# Patient Record
Sex: Male | Born: 1970 | Race: Asian | Hispanic: No | Marital: Married | Smoking: Never smoker
Health system: Southern US, Community
[De-identification: ages and names within clinical notes are randomized; demographics above are authoritative.]

## PROBLEM LIST (undated history)

## (undated) DIAGNOSIS — G44209 Tension-type headache, unspecified, not intractable: Secondary | ICD-10-CM

## (undated) HISTORY — DX: Tension-type headache, unspecified, not intractable: G44.209

## (undated) HISTORY — PX: NO PAST SURGERIES: SHX2092

---

## 2012-12-02 ENCOUNTER — Ambulatory Visit
Admission: RE | Admit: 2012-12-02 | Discharge: 2012-12-02 | Disposition: A | Discharge status: Home / Self Care | Payer: No Typology Code available for payment source | Source: Ambulatory Visit | Attending: Family Medicine | Admitting: Family Medicine

## 2012-12-02 ENCOUNTER — Other Ambulatory Visit: Payer: Self-pay | Admitting: Family Medicine

## 2012-12-02 DIAGNOSIS — R9389 Abnormal findings on diagnostic imaging of other specified body structures: Secondary | ICD-10-CM

## 2012-12-02 DIAGNOSIS — R509 Fever, unspecified: Secondary | ICD-10-CM

## 2012-12-02 DIAGNOSIS — R1110 Vomiting, unspecified: Secondary | ICD-10-CM

## 2012-12-02 DIAGNOSIS — R059 Cough, unspecified: Secondary | ICD-10-CM

## 2012-12-02 DIAGNOSIS — R05 Cough: Secondary | ICD-10-CM

## 2012-12-02 MED ORDER — IOHEXOL 300 MG/ML  SOLN
75.0000 mL | Freq: Once | INTRAMUSCULAR | Status: AC | PRN
  Administered 2012-12-02: 75 mL via INTRAVENOUS

## 2013-06-01 ENCOUNTER — Ambulatory Visit
Admission: RE | Admit: 2013-06-01 | Discharge: 2013-06-01 | Disposition: A | Discharge status: Home / Self Care | Payer: PRIVATE HEALTH INSURANCE | Source: Ambulatory Visit | Attending: Infectious Disease | Admitting: Infectious Disease

## 2013-06-01 ENCOUNTER — Other Ambulatory Visit: Payer: Self-pay | Admitting: Infectious Disease

## 2013-06-01 DIAGNOSIS — A159 Respiratory tuberculosis unspecified: Secondary | ICD-10-CM

## 2013-10-04 ENCOUNTER — Other Ambulatory Visit: Payer: Self-pay | Admitting: Family Medicine

## 2013-10-04 ENCOUNTER — Ambulatory Visit
Admission: RE | Admit: 2013-10-04 | Discharge: 2013-10-04 | Disposition: A | Discharge status: Home / Self Care | Payer: PRIVATE HEALTH INSURANCE | Source: Ambulatory Visit | Attending: Family Medicine | Admitting: Family Medicine

## 2013-10-04 DIAGNOSIS — R7611 Nonspecific reaction to tuberculin skin test without active tuberculosis: Secondary | ICD-10-CM

## 2014-09-11 ENCOUNTER — Ambulatory Visit: Payer: PRIVATE HEALTH INSURANCE | Admitting: Physician Assistant

## 2014-09-11 ENCOUNTER — Telehealth: Payer: Self-pay

## 2014-09-11 NOTE — Telephone Encounter (Signed)
Spoke with patient brother who states that he will advise patient. He states that his brother does not speak english very well.

## 2014-09-12 ENCOUNTER — Encounter: Payer: Self-pay | Admitting: Physician Assistant

## 2014-09-12 ENCOUNTER — Ambulatory Visit (INDEPENDENT_AMBULATORY_CARE_PROVIDER_SITE_OTHER): Payer: PRIVATE HEALTH INSURANCE | Admitting: Physician Assistant

## 2014-09-12 VITALS — BP 121/75 | HR 59 | Temp 98.5°F | Resp 16 | Ht 66.0 in | Wt 154.0 lb

## 2014-09-12 DIAGNOSIS — R51 Headache: Secondary | ICD-10-CM

## 2014-09-12 DIAGNOSIS — R519 Headache, unspecified: Secondary | ICD-10-CM

## 2014-09-12 DIAGNOSIS — G44209 Tension-type headache, unspecified, not intractable: Secondary | ICD-10-CM | POA: Insufficient documentation

## 2014-09-12 LAB — LIPID PANEL
Cholesterol: 171 mg/dL (ref 0–200)
HDL: 35.7 mg/dL — ABNORMAL LOW (ref >39.00)
NonHDL: 135.3
Total CHOL/HDL Ratio: 5
Triglycerides: 311 mg/dL — ABNORMAL HIGH (ref 0.0–149.0)
VLDL: 62.2 mg/dL — ABNORMAL HIGH (ref 0.0–40.0)

## 2014-09-12 LAB — CBC
HCT: 46.3 % (ref 39.0–52.0)
Hemoglobin: 15.6 g/dL (ref 13.0–17.0)
MCHC: 33.8 g/dL (ref 30.0–36.0)
MCV: 86.3 fl (ref 78.0–100.0)
Platelets: 259 10*3/uL (ref 150.0–400.0)
RBC: 5.37 Mil/uL (ref 4.22–5.81)
RDW: 13.6 % (ref 11.5–15.5)
WBC: 5.6 10*3/uL (ref 4.0–10.5)

## 2014-09-12 LAB — COMPREHENSIVE METABOLIC PANEL
ALT: 35 U/L (ref 0–53)
AST: 24 U/L (ref 0–37)
Albumin: 4.2 g/dL (ref 3.5–5.2)
Alkaline Phosphatase: 74 U/L (ref 39–117)
BUN: 12 mg/dL (ref 6–23)
CO2: 29 mEq/L (ref 19–32)
Calcium: 9.2 mg/dL (ref 8.4–10.5)
Chloride: 105 mEq/L (ref 96–112)
Creatinine, Ser: 0.88 mg/dL (ref 0.40–1.50)
GFR: 100.85 mL/min (ref >60.00)
Glucose, Bld: 93 mg/dL (ref 70–99)
Potassium: 3.9 mEq/L (ref 3.5–5.1)
Sodium: 139 mEq/L (ref 135–145)
Total Bilirubin: 0.4 mg/dL (ref 0.2–1.2)
Total Protein: 7.1 g/dL (ref 6.0–8.3)

## 2014-09-12 LAB — SEDIMENTATION RATE: Sed Rate: 8 mm/hr (ref 0–22)

## 2014-09-12 LAB — LDL CHOLESTEROL, DIRECT: Direct LDL: 100 mg/dL

## 2014-09-12 NOTE — Progress Notes (Signed)
Pre visit review using our clinic review tool, if applicable. No additional management support is needed unless otherwise documented below in the visit note. 

## 2014-09-12 NOTE — Assessment & Plan Note (Signed)
One current seems like a tension headache. No recurrence. Patient asymptomatic at present. Discussed supportive measures. Do not think a big workup is warranted at present. Patient seems concerned about this episode. Will check CBC, CMP lipase and sedimentation rate.

## 2014-09-12 NOTE — Progress Notes (Signed)
   Patient presents to clinic today to establish care.  History is provided by the patient's sister, as the patient does not speak [place]. Interpreter was scheduled, but did not show up. Patient had an episode of a severe headache 2 weeks ago while at work, described as a vice-like grip on the back of his head. Endorses some mild dizziness with that episode. Headache resolved over 30 minute timeframe. Patient did not take anything for headache. Denies altered mental status or loss of consciousness. Denies vision changes. Denies history of similar headache. Denies recurrence of symptoms.   Endorses he is healthy overall. Is overdue for CPE.  History reviewed. No pertinent past medical history.  Past Surgical History  Procedure Laterality Date  . No past surgeries      No current outpatient prescriptions on file prior to visit.   No current facility-administered medications on file prior to visit.    No Known Allergies  History reviewed. No pertinent family history.  History   Social History  . Marital Status: Married    Spouse Name: N/A  . Number of Children: N/A  . Years of Education: N/A   Occupational History  . Not on file.   Social History Main Topics  . Smoking status: Never Smoker   . Smokeless tobacco: Never Used  . Alcohol Use: No  . Drug Use: No  . Sexual Activity: Not Currently   Other Topics Concern  . Not on file   Social History Narrative   ROS Pertinent ROS are listed in the HPI.  BP 121/75 mmHg  Pulse 59  Temp(Src) 98.5 F (36.9 C) (Oral)  Resp 16  Ht 5\' 6"  (1.676 m)  Wt 154 lb (69.854 kg)  BMI 24.87 kg/m2  SpO2 99%  Physical Exam  Constitutional: He is oriented to person, place, and time and well-developed, well-nourished, and in no distress.  HENT:  Head: Normocephalic and atraumatic.  Right Ear: External ear normal.  Left Ear: External ear normal.  Nose: Nose normal.  Mouth/Throat: Oropharynx is clear and moist. No oropharyngeal  exudate.  Tympanic membranes within normal limits bilaterally.  Eyes: Conjunctivae are normal.  Neck: Neck supple.  Cardiovascular: Normal rate, regular rhythm, normal heart sounds and intact distal pulses.   Pulmonary/Chest: Effort normal and breath sounds normal. No respiratory distress. He has no wheezes. He has no rales. He exhibits no tenderness.  Neurological: He is alert and oriented to person, place, and time.  Skin: Skin is warm and dry. No rash noted.  Psychiatric: Affect normal.  Vitals reviewed.  Assessment/Plan: Cephalalgia One current seems like a tension headache. No recurrence. Patient asymptomatic at present. Discussed supportive measures. Do not think a big workup is warranted at present. Patient seems concerned about this episode. Will check CBC, CMP lipase and sedimentation rate.

## 2014-09-12 NOTE — Patient Instructions (Signed)
Please go to the lab for blood work.   I will call you with your results. Please take Tylenol as needed for headache. Apply Topical Icy hot to the neck each day before work.  Return in 2 weeks.

## 2014-09-19 ENCOUNTER — Telehealth: Payer: Self-pay | Admitting: Physician Assistant

## 2014-09-19 ENCOUNTER — Encounter: Payer: Self-pay | Admitting: *Deleted

## 2014-09-19 NOTE — Telephone Encounter (Signed)
Caller name:Aaron Watts Relation to ZO:XWRUEApt:Friend Call back [phone] Pharmacy:  Reason for call: pt's friend is calling wanting to get lab results that were done on 3-23/16, [name] is on pt's DPR, pt does not speak very good english.

## 2014-09-19 NOTE — Telephone Encounter (Signed)
Notified friend and he voices understanding.

## 2014-09-26 ENCOUNTER — Ambulatory Visit (INDEPENDENT_AMBULATORY_CARE_PROVIDER_SITE_OTHER): Payer: PRIVATE HEALTH INSURANCE | Admitting: Physician Assistant

## 2014-09-26 ENCOUNTER — Encounter: Payer: Self-pay | Admitting: Physician Assistant

## 2014-09-26 VITALS — BP 110/69 | HR 62 | Temp 97.7°F | Resp 16 | Ht 66.0 in | Wt 153.4 lb

## 2014-09-26 DIAGNOSIS — E781 Pure hyperglyceridemia: Secondary | ICD-10-CM

## 2014-09-26 DIAGNOSIS — G44219 Episodic tension-type headache, not intractable: Secondary | ICD-10-CM | POA: Diagnosis not present

## 2014-09-26 NOTE — Patient Instructions (Signed)
Please apply topical Aspercreme to the posterior neck daily. Stay well hydrated.  Eat throughout the day to prevent lightheadedness as a result of low blood sugar.  Please start a daily fish oil supplement for your elevated Triglycerides.  Also limit sodas and alcohol.  We will recheck this level in 3 months.  ?au ??u Do C?ng Th?ng (Tension Headache) ?au ??u do c?ng th?ng l m?t c?m gic ?au, p l?c ho?c ?au th??ng c?m th?y pha tr??c v hai bn ??u. C?n ?au c th? khng r rng ho?c c th? c?m th?y ch?t (th?t). ?y l ki?u ?au ??u ph? bi?n nh?t. ?au ??u c?ng th?ng th??ng khng km theo bu?n nn ho?c nn m?a v khng tr? nn t?i t? h?n v?i ho?t ??ng th? ch?t. ?au ??u do c?ng th?ng c th? ko di 30 pht ??n vi ngy.  NGUYN NHN Nguyn nhn chnh xc khng ???c xc ??nh, nh?ng c th? gy ra b?i ha ch?t v hocmon trong no d?n ??n ?au. ?au ??u do c?ng th?ng th??ng b?t ??u sau khi c?ng th?ng, lo u ho?c tr?m c?m. Cc tc nhn khc c th? bao g?m:  R??u.  Caffeine (qu nhi?u ho?c thu h?i).  Nhi?m trng h h?p (c?m l?nh, cm, vim xoang).  V?n ?? nha khoa ho?c si?t ch?t r?ng.  M?t m?i.  Gi? ??u v c? c?a b?n ? m?t v? tr qu lu trong khi s? d?ng my tnh. TRI?U CH?NG  C?ng th?ng xung quanh ??u.  ?au ??u khng r rng.  C?m th?y ?au pha tr??c v hai bn ??u.  Nh?y c?m ?au ? cc c? c?a ??u, c? v vai. CH?N ?ON ?au ??u do c?ng th?ng th??ng ???c ch?n ?on d?a vo:  Cc tri?u ch?ng.  Khm th?c th?.  Ch?p CT ho?c MRI ??u c?a b?n. C th? yu c?u cc xt nghi?m n?u c cc tri?u ch?ng n?ng ho?c b?t th??ng. ?I?U TR? Thu?c c th? ???c ch? ??nh ?? gip gi?m cc tri?u ch?ng. H??NG D?N CH?M [place] T?I NH  Ch? s? d?ng thu?ckhng c?n k toa ho?c thu?c c?n k toa ?? gi?m ?au ho?c gi?m c?m gic kh ch?u theo ch? d?n c?a chuyn gia ch?m [place] s?c kh?e c?a b?n.  N?m xu?ng trong m?t c?n phng t?i, yn t?nh, khi b?n b? ?au ??u.  Ghi chp l?i ?? tm hi?u nh?ng g c th? gy ra ch?ng ?au ??u c?a  b?n. V d?, ghi l?i:  Nh?ng g b?n ?n v u?ng.  B?n ng? ???c bao lu.  B?t k? thay ??i no c?a ch? ?? ?n u?ng ho?c cc lo?i thu?c c?a b?n.  Hy th? xoa bp ho?c cc k? thu?t th? gin khc.  C th? ch??m n??c ? ho?c nng ln ??u v c?. S? d?ng cc cch ny t? 3 ??n 4 l?n m?i ngy trong 15 ??n 20 pht m?i l?n, ho?c khi c?n thi?t.  H?n ch? c?ng th?ng.  Ng?i th?ng v khng lm c?ng c? c?a b?n.  B? thu?c l, n?u b?n ht thu?c.  H?n ch? s? d?ng r??u.  Gi?m l??ng caffeine b?n u?ng ho?c ng?ng u?ng caffeine.  ?n v t?p th? d?c th??ng xuyn.  Ng? t? 7 ??n 9 ti?ng, ho?c theo l?i khuyn c?a chuyn gia ch?m [place] s?c kh?e.  [place] s? d?ng thu?c gi?m ?au qu m??c, vi? co? th? xa?y ra ?au ??u ti pht. HY ?I KHM N?U:  B?n c cc v?n ?? v?i cc lo?i thu?c ???c ch? ??nh.  Thu?c c?a  b?n khng c tc d?ng.  ?au ??u thng th??ng thay ??i khng bnh th??ng.  B?n b? bu?n nn ho?c nn m?a. HY NGAY L?P T?C ?I KHM N?U:  C?n ?au ??u c?a b?n tr? nn tr?m tr?ng.  B?n b? s?t.  B?n b? c?ng c?.  B?n b? m?t th? l?c.  B?n b? y?u c? ho?c m?t ki?m sot c? b?p.  B?n b? m?t th?ng b?ng ho?c c v?n ?? v?i vi?c ?i b?.  B?n c?m th?y mu?n ng?t ho?c ng?t.  B?n c nh?ng tri?u ch?ng nghim tr?ng khc v?i cc tri?u ch?ng ??u tin. ??M B?O B?N:  Hi?u cc h??ng d?n ny.  S? theo di tnh tr?ng c?a mnh.  S? yu c?u tr? gip ngay l?p t?c n?u b?n c?m th?y khng ?? ho?c tnh tr?ng tr?m tr?ng h?n. Document Released: 06/08/2005 Document Revised: 02/08/2013 [facility] Patient Information 2015 [employer], [place]. This information is not intended to replace advice given to you by your health care provider. Make sure you discuss any questions you have with your health care provider.

## 2014-09-26 NOTE — Progress Notes (Signed)
Pre visit review using our clinic review tool, if applicable. No additional management support is needed unless otherwise documented below in the visit note/SLS  

## 2014-09-30 DIAGNOSIS — E781 Pure hyperglyceridemia: Secondary | ICD-10-CM | POA: Insufficient documentation

## 2014-09-30 NOTE — Assessment & Plan Note (Signed)
No recurrence noted.  Supportive measures reiterated to patient via translator.  Follow-up if symptoms recur.

## 2014-09-30 NOTE — Assessment & Plan Note (Signed)
Discussed dietary and lifestyle modifications.  Will begin fish oil supplement daily. Repeat TGL levels in 3 months.

## 2014-09-30 NOTE — Progress Notes (Signed)
Patient presents to clinic today for 2 week follow-up of headaches and to discuss lab results.  Translator is available at this visit.  Patient denies recurrence of headaches. Has been staying well hydrated and not skipping meals while working.  Denies new concern today.  No past medical history on file.  No current outpatient prescriptions on file prior to visit.   No current facility-administered medications on file prior to visit.    No Known Allergies  No family history on file.  History   Social History  . Marital Status: Married    Spouse Name: N/A  . Number of Children: N/A  . Years of Education: N/A   Social History Main Topics  . Smoking status: Never Smoker   . Smokeless tobacco: Never Used  . Alcohol Use: No  . Drug Use: No  . Sexual Activity: Not Currently   Other Topics Concern  . None   Social History Narrative   Review of Systems - See HPI.  All other ROS are negative.  BP 110/69 mmHg  Pulse 62  Temp(Src) 97.7 F (36.5 C) (Oral)  Resp 16  Ht 5' 6"  (1.676 m)  Wt 153 lb 6 oz (69.57 kg)  BMI 24.77 kg/m2  SpO2 99%  Physical Exam  Constitutional: He is oriented to person, place, and time and well-developed, well-nourished, and in no distress.  HENT:  Head: Normocephalic and atraumatic.  Nose: Nose normal.  Mouth/Throat: Uvula is midline, oropharynx is clear and moist and mucous membranes are normal.  Eyes: Conjunctivae are normal.  Neck: Neck supple.  Cardiovascular: Normal rate, regular rhythm, normal heart sounds and intact distal pulses.   Pulmonary/Chest: Effort normal and breath sounds normal. No respiratory distress. He has no wheezes. He has no rales. He exhibits no tenderness.  Neurological: He is alert and oriented to person, place, and time.  Skin: Skin is warm and dry. No rash noted.  Psychiatric: Affect normal.  Vitals reviewed.   Recent Results (from the past 2160 hour(s))  CBC     Status: None   Collection Time: 09/12/14   9:08 AM  Result Value Ref Range   WBC 5.6 4.0 - 10.5 K/uL   RBC 5.37 4.22 - 5.81 Mil/uL   Platelets 259.0 150.0 - 400.0 K/uL   Hemoglobin 15.6 13.0 - 17.0 g/dL   HCT 46.3 39.0 - 52.0 %   MCV 86.3 78.0 - 100.0 fl   MCHC 33.8 30.0 - 36.0 g/dL   RDW 13.6 11.5 - 15.5 %  Sed Rate (ESR)     Status: None   Collection Time: 09/12/14  9:08 AM  Result Value Ref Range   Sed Rate 8 0 - 22 mm/hr  Comp Met (CMET)     Status: None   Collection Time: 09/12/14  9:08 AM  Result Value Ref Range   Sodium 139 135 - 145 mEq/L   Potassium 3.9 3.5 - 5.1 mEq/L   Chloride 105 96 - 112 mEq/L   CO2 29 19 - 32 mEq/L   Glucose, Bld 93 70 - 99 mg/dL   BUN 12 6 - 23 mg/dL   Creatinine, Ser 0.88 0.40 - 1.50 mg/dL   Total Bilirubin 0.4 0.2 - 1.2 mg/dL   Alkaline Phosphatase 74 39 - 117 U/L   AST 24 0 - 37 U/L   ALT 35 0 - 53 U/L   Total Protein 7.1 6.0 - 8.3 g/dL   Albumin 4.2 3.5 - 5.2 g/dL   Calcium 9.2  8.4 - 10.5 mg/dL   GFR 100.85 >60.00 mL/min  Lipid panel     Status: Abnormal   Collection Time: 09/12/14  9:08 AM  Result Value Ref Range   Cholesterol 171 0 - 200 mg/dL    Comment: ATP III Classification       Desirable:  < 200 mg/dL               Borderline High:  200 - 239 mg/dL          High:  > = 240 mg/dL   Triglycerides 311.0 (H) 0.0 - 149.0 mg/dL    Comment: Normal:  <150 mg/dLBorderline High:  150 - 199 mg/dL   HDL 35.70 (L) >39.00 mg/dL   VLDL 62.2 (H) 0.0 - 40.0 mg/dL   Total CHOL/HDL Ratio 5     Comment:                Men          Women1/2 Average Risk     3.4          3.3Average Risk          5.0          4.42X Average Risk          9.6          7.13X Average Risk          15.0          11.0                       NonHDL 135.30     Comment: NOTE:  Non-HDL goal should be 30 mg/dL higher than patient's LDL goal (i.e. LDL goal of < 70 mg/dL, would have non-HDL goal of < 100 mg/dL)  LDL cholesterol, direct     Status: None   Collection Time: 09/12/14  9:08 AM  Result Value Ref Range   Direct  LDL 100.0 mg/dL    Comment: Optimal:  <100 mg/dLNear or Above Optimal:  100-129 mg/dLBorderline High:  130-159 mg/dLHigh:  160-189 mg/dLVery High:  >190 mg/dL    Assessment/Plan: Tension type headache No recurrence noted.  Supportive measures reiterated to patient via translator.  Follow-up if symptoms recur.   High triglycerides Discussed dietary and lifestyle modifications.  Will begin fish oil supplement daily. Repeat TGL levels in 3 months.

## 2014-12-26 ENCOUNTER — Ambulatory Visit: Payer: PRIVATE HEALTH INSURANCE | Admitting: Physician Assistant

## 2014-12-27 ENCOUNTER — Telehealth: Payer: Self-pay | Admitting: Physician Assistant

## 2014-12-27 ENCOUNTER — Encounter: Payer: Self-pay | Admitting: Physician Assistant

## 2014-12-27 NOTE — Telephone Encounter (Signed)
Pt was no show 12/26/14 9:15am, follow up 15, pt has not rescheduled, mailing letter, charge no show?

## 2014-12-28 NOTE — Telephone Encounter (Signed)
No charge. Relies on family to bring him and translators so will not charge for first no show.

## 2016-05-27 ENCOUNTER — Telehealth: Payer: Self-pay | Admitting: Physician Assistant

## 2016-05-27 NOTE — Telephone Encounter (Signed)
Ok with me 

## 2016-05-27 NOTE — Telephone Encounter (Signed)
Patient would like to transfer from [place] to [facility] due to PCP relocating, please advise °

## 2016-05-27 NOTE — Telephone Encounter (Signed)
Will accept pt but only on day he comes in to see me. Don't assign him to me unless he actually sees me. Looks like he rarely comes in and has not been seen in more than a year. So don't want to technically be his pcp in epic until I see him. 45 year old and looks like would benefit from a CPE. So would you offer for him to schedule that and ask he come in fasting. Schedule him early in morning. If he declines let me know. But again don't switch him to me yet.

## 2016-05-28 NOTE — Telephone Encounter (Signed)
Patient scheduled for 06/30/2016 with [facility] °

## 2016-06-30 ENCOUNTER — Ambulatory Visit: Payer: PRIVATE HEALTH INSURANCE | Admitting: Medical

## 2016-07-28 ENCOUNTER — Telehealth: Payer: Self-pay | Admitting: Physician Assistant

## 2016-07-28 NOTE — Telephone Encounter (Signed)
Caller name: Chrissie NoaWilliam Relation to pt: friend  Call back number: [phone]  Reason for call:  [facility] Roller[facility]endling, please advise

## 2016-07-28 NOTE — Telephone Encounter (Signed)
Ok with me 

## 2016-07-29 NOTE — Telephone Encounter (Signed)
Patient scheduled for 08/17/16 at 2:30pm

## 2016-07-29 NOTE — Telephone Encounter (Signed)
OK 

## 2016-08-17 ENCOUNTER — Ambulatory Visit: Payer: PRIVATE HEALTH INSURANCE | Admitting: Family Medicine

## 2017-09-02 ENCOUNTER — Encounter: Payer: Self-pay | Admitting: Family Medicine

## 2017-09-02 ENCOUNTER — Ambulatory Visit: Payer: BLUE CROSS/BLUE SHIELD | Admitting: Family Medicine

## 2017-09-02 ENCOUNTER — Telehealth: Payer: Self-pay | Admitting: Physician Assistant

## 2017-09-02 ENCOUNTER — Ambulatory Visit (HOSPITAL_BASED_OUTPATIENT_CLINIC_OR_DEPARTMENT_OTHER)
Admission: RE | Admit: 2017-09-02 | Discharge: 2017-09-02 | Disposition: A | Discharge status: Home / Self Care | Payer: BLUE CROSS/BLUE SHIELD | Source: Ambulatory Visit | Attending: Family Medicine | Admitting: Family Medicine

## 2017-09-02 VITALS — BP 110/80 | HR 66 | Temp 98.6°F | Ht 66.0 in | Wt 154.4 lb

## 2017-09-02 DIAGNOSIS — R05 Cough: Secondary | ICD-10-CM | POA: Diagnosis not present

## 2017-09-02 DIAGNOSIS — R918 Other nonspecific abnormal finding of lung field: Secondary | ICD-10-CM | POA: Insufficient documentation

## 2017-09-02 DIAGNOSIS — R059 Cough, unspecified: Secondary | ICD-10-CM

## 2017-09-02 NOTE — Progress Notes (Signed)
Chief Complaint  Patient presents with  . Cough    Jaquelyn BitterPhong Reddinger here for URI complaints. Here with niece and mother. Niece helps interpret.  Duration: 1 month Associated symptoms: subjective fever, sinus congestion, rhinorrhea, sore throat and cough Denies: sinus pain, itchy watery eyes, ear fullness, ear pain, ear drainage, wheezing, shortness of breath, myalgia and weight loss, night sweats Treatment to date: Delsym Sick contacts: No  He has a hx of Tb and [place] when [place] symptoms started.  ROS:  Const: Denies fevers HEENT: As noted in HPI Lungs: No SOB, +cough  Past Medical History:  Diagnosis Date  . Tension headache      BP 110/80 (BP Location: Left Arm, Patient Position: Sitting, Cuff Size: Normal)   Pulse 66   Temp 98.6 F (37 C) (Oral)   Ht 5\' 6"  (1.676 m)   Wt 154 lb 6 oz (70 kg)   SpO2 95%   BMI 24.92 kg/m  General: Awake, alert, appears stated age HEENT: AT, [place], ears patent b/l and TM's neg, nares patent w/o discharge, pharynx pink and without exudates, MMM Neck: No masses or asymmetry Heart: RRR Lungs: Decreased breath sounds diffusely, CTAB otherwise, no accessory muscle use Psych: Age appropriate judgment and insight, normal mood and affect  Cough - Plan: DG Chest 2 View  Orders as above. If neg, will tx with Zpak for bacterial bronchitis as it has been going on for >3 weeks as well as cough suppressant. If showing concern for Tb, will contact health dept or ID team for further rec as he was on [place] in 2014 for 6 mo.  Continue to push fluids, practice good hand hygiene, cover mouth when coughing. F/u prn otherwise. Pt and his family voiced understanding and agreement to the plan.  Jilda Rocheicholas Paul TamiamiWendling, DO 09/02/17 3:14 PM

## 2017-09-02 NOTE — Progress Notes (Signed)
Pre visit review using our clinic review tool, if applicable. No additional management support is needed unless otherwise documented below in the visit note. 

## 2017-09-02 NOTE — Patient Instructions (Addendum)
Continue to push fluids, practice good hand hygiene, and cover your mouth if you cough.  If you start having fevers, shaking or shortness of breath, seek immediate care.  Get an X-ray downstairs. If it is normal, I will call in a medicine. If abnormal, we will call you to let you know the next steps.  Let us know if you need anything.

## 2017-09-02 NOTE — Telephone Encounter (Signed)
[facility] from [facility] calling with stat results from chest x-ray. Bjorn LoserRhonda states that the pt was confirmed to have pneumonia. Pt was seen for OV today with Dr. Carmelia RollerWendling. Pt's PCP is Laural [place]ody [place].

## 2017-09-03 MED ORDER — DOXYCYCLINE HYCLATE 100 MG PO TABS: 100.0000 mg | ORAL_TABLET | Freq: Two times a day (BID) | ORAL | 0 refills | Status: AC

## 2017-09-03 NOTE — Telephone Encounter (Signed)
CXR notes acknowledged, will trial pt on 7 days of doxy and follow up with either us or reg pcp in 4 weeks. Spoke with brother about dx and tx. He would like us to schedule an appt and call to let him know when it is for sake of ease. Zella BallRobin, can you reach out to patient's brother with an appointment in 4 week please? TY.

## 2017-09-03 NOTE — Telephone Encounter (Signed)
Called and spoke to the brother and scheduled a followup with Dr. Carmelia RollerWendling 10/01/2017 at 4 pm.

## 2017-10-01 ENCOUNTER — Encounter: Payer: Self-pay | Admitting: Family Medicine

## 2017-10-01 ENCOUNTER — Ambulatory Visit (HOSPITAL_BASED_OUTPATIENT_CLINIC_OR_DEPARTMENT_OTHER)
Admission: RE | Admit: 2017-10-01 | Discharge: 2017-10-01 | Disposition: A | Discharge status: Home / Self Care | Payer: BLUE CROSS/BLUE SHIELD | Source: Ambulatory Visit | Attending: Family Medicine | Admitting: Family Medicine

## 2017-10-01 ENCOUNTER — Ambulatory Visit: Payer: BLUE CROSS/BLUE SHIELD | Admitting: Family Medicine

## 2017-10-01 VITALS — BP 110/70 | HR 64 | Temp 98.3°F | Ht 66.0 in | Wt 154.0 lb

## 2017-10-01 DIAGNOSIS — R911 Solitary pulmonary nodule: Secondary | ICD-10-CM | POA: Diagnosis not present

## 2017-10-01 NOTE — Progress Notes (Signed)
Chief Complaint  Patient presents with  . Follow-up    pneumonia    Subjective: Patient is a 47 y.o. male here for f/u PNA.  He is here with his mother and niece and uses the aid of a [place] interpreter.  Seen and diagnosed with pneumonia on 09/02/16.  On chest x-ray, he did take down the appearance concerning for possible malignancy.  3-4 week follow-up chest x-ray was recommended after antibiotics.  He did finish a 7-day course of doxycycline and presents today free of symptoms.  ROS: Heart: Denies chest pain   Lungs: Denies SOB   Past Medical History:  Diagnosis Date  . Tension headache     Objective: BP 110/70 (BP Location: Left Arm, Patient Position: Sitting, Cuff Size: Normal)   Pulse 64   Temp 98.3 F (36.8 C) (Oral)   Ht 5\' 6"  (1.676 m)   Wt 154 lb (69.9 kg)   SpO2 97%   BMI 24.86 kg/m  General: Awake, appears stated age HEENT: MMM, EOMi Heart: RRR, no murmurs Lungs: CTAB, no rales, wheezes or rhonchi. No accessory muscle use Psych: Age appropriate judgment and insight, normal affect and mood  Assessment and Plan: Pulmonary nodule - Plan: DG Chest 2 View  Follow-up on infiltrate to ensure that it was likely related to infection rather than malignancy. Nehemiah SettleDavid Wideman- brother- [phone]- first option for X-ray results. [phone] Bucker- Thanh (tawn)- niece. F/u prn.  The patient voiced understanding and agreement to the plan.  Jilda Rocheicholas Paul [place], DO 10/01/17  4:25 PM

## 2017-10-01 NOTE — Patient Instructions (Signed)
We will let you know the results of your X-ray next week.   Let us know if you need anything.

## 2017-10-01 NOTE — Progress Notes (Signed)
Pre visit review using our clinic review tool, if applicable. No additional management support is needed unless otherwise documented below in the visit note. 

## 2018-11-02 IMAGING — CR DG CHEST 2V
2 series · 2 of 2 positions shown · non-contrast
Comparison: 09/02/2017, 10/04/2013

CLINICAL DATA: Follow-up pulmonary nodule

EXAM:
CHEST - 2 VIEW

[w chest pa]
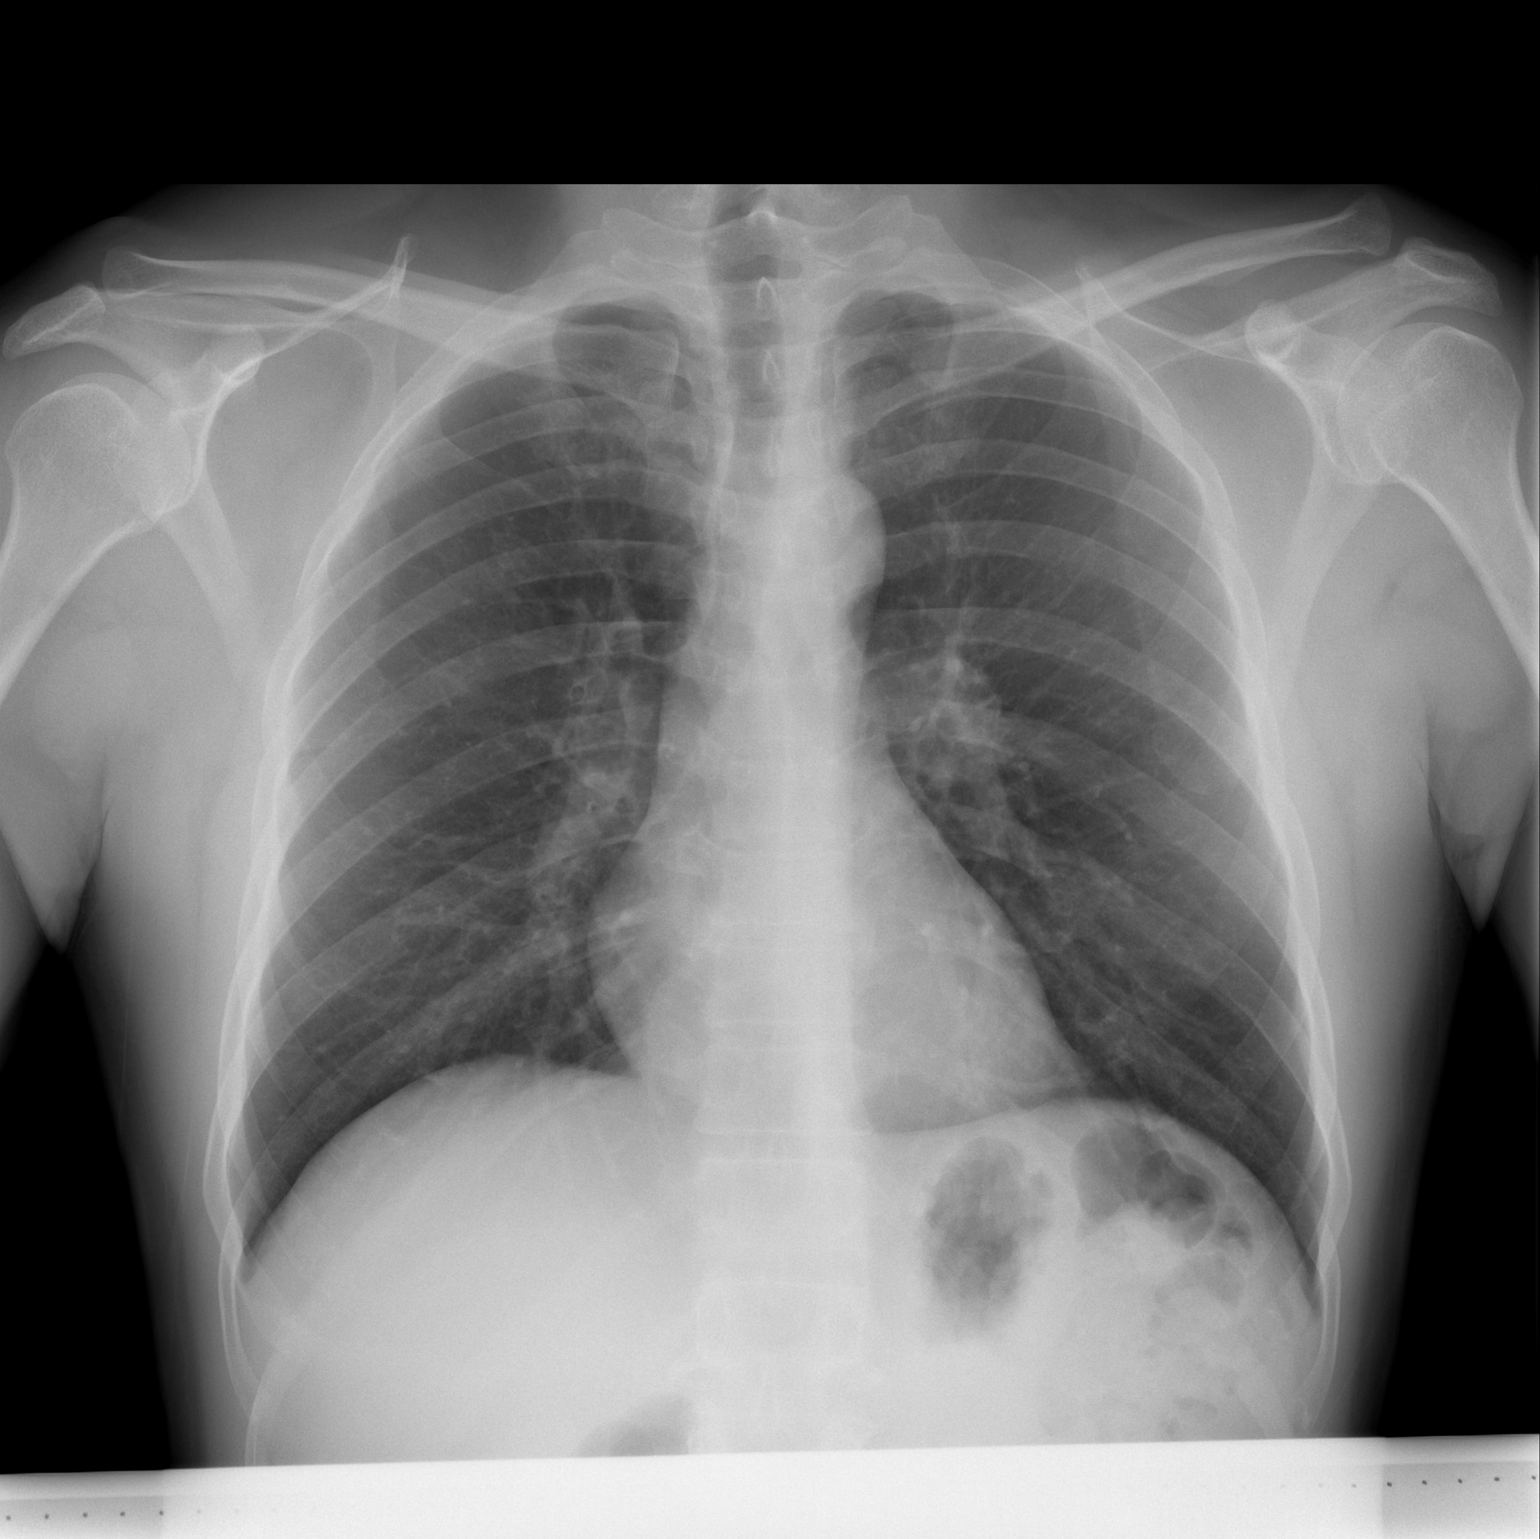

[w chest lat]
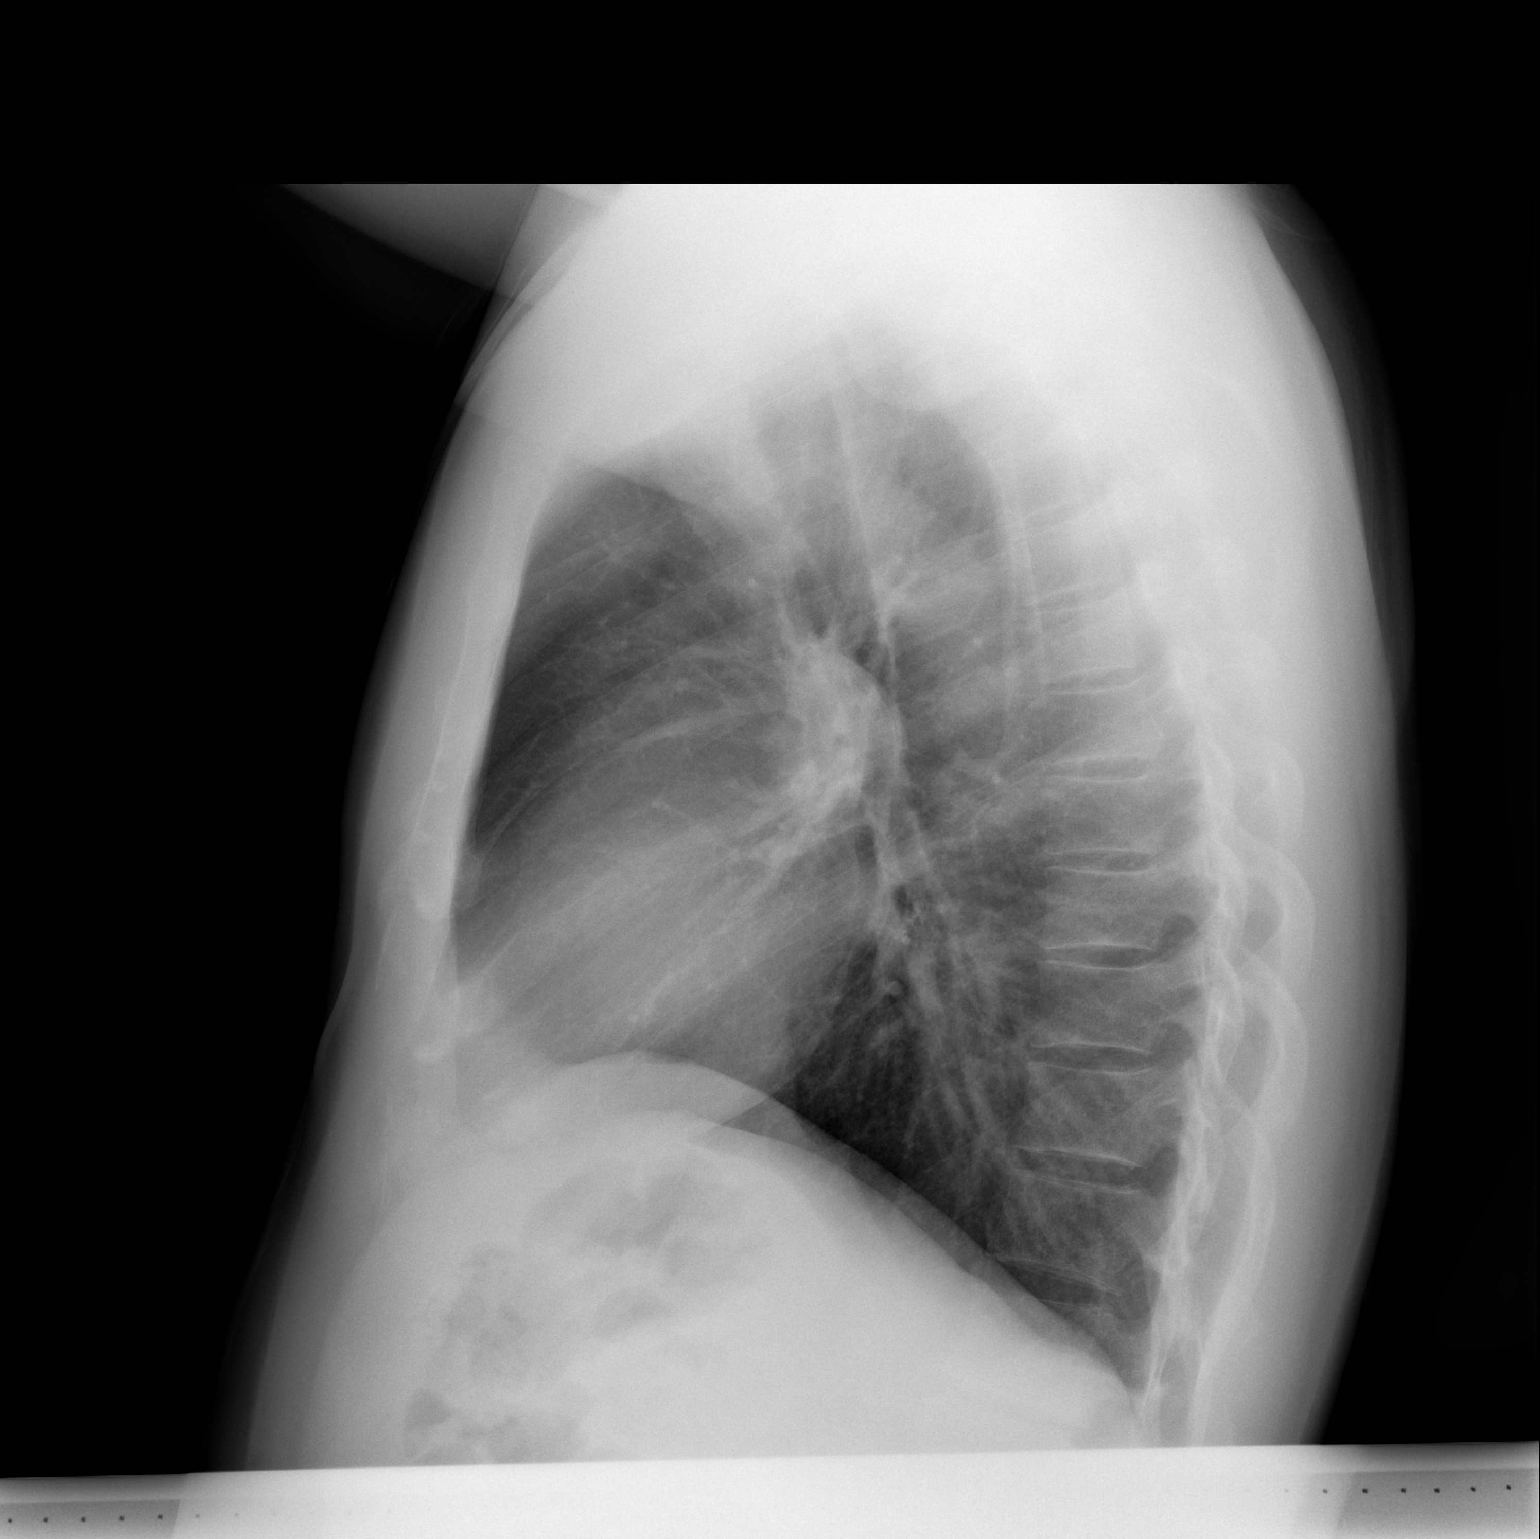

[2 of 2 positions shown; findings below may reference images not displayed]

FINDINGS: Previously noted right upper lung opacity is resolved. No
consolidation or effusion. Normal heart size. No pneumothorax.
IMPRESSION: No active cardiopulmonary disease.

## 2019-09-07 ENCOUNTER — Ambulatory Visit: Payer: Self-pay | Attending: Internal Medicine

## 2019-09-07 DIAGNOSIS — Z23 Encounter for immunization: Secondary | ICD-10-CM

## 2019-09-07 NOTE — Progress Notes (Signed)
   Covid-19 Vaccination Clinic  Name:  Aaron Watts    MRN: [id number] DOB: 1970-08-17  09/07/2019  [name] was observed post Covid-19 immunization for 15 minutes without incident. He was provided with Vaccine Information Sheet and instruction to access the V-Safe system.   [name] was instructed to call 911 with any severe reactions post vaccine: Marland Kitchen Difficulty breathing  . Swelling of face and throat  . A fast heartbeat  . A bad rash all over body  . Dizziness and weakness   Immunizations Administered    Name Date Dose VIS Date Route   Pfizer COVID-19 Vaccine 09/07/2019  4:35 PM 0.3 mL 06/02/2019 Intramuscular   Manufacturer: [employer], [employer]   Lot: [id number]   NDC: [address]-5

## 2019-10-03 ENCOUNTER — Ambulatory Visit: Payer: Self-pay | Attending: Neurology

## 2019-10-03 DIAGNOSIS — Z23 Encounter for immunization: Secondary | ICD-10-CM

## 2019-10-03 NOTE — Progress Notes (Signed)
   Covid-19 Vaccination Clinic  Name:  Aaron Watts    MRN: [id number] DOB: May 24, 1971  10/03/2019  [name] was observed post Covid-19 immunization for 15 minutes without incident. He was provided with Vaccine Information Sheet and instruction to access the V-Safe system.   [name] was instructed to call 911 with any severe reactions post vaccine: Marland Kitchen Difficulty breathing  . Swelling of face and throat  . A fast heartbeat  . A bad rash all over body  . Dizziness and weakness   Immunizations Administered    Name Date Dose VIS Date Route   Pfizer COVID-19 Vaccine 10/03/2019  4:17 PM 0.3 mL 06/02/2019 Intramuscular   Manufacturer: [employer], [employer]   Lot: [id number]   NDC: [address]-3
